# Patient Record
Sex: Female | Born: 1980 | Race: White | Hispanic: No | Marital: Married | Smoking: Never smoker
Health system: Southern US, Community
[De-identification: ages and names within clinical notes are randomized; demographics above are authoritative.]

## PROBLEM LIST (undated history)

## (undated) DIAGNOSIS — F32A Depression, unspecified: Secondary | ICD-10-CM

## (undated) DIAGNOSIS — D689 Coagulation defect, unspecified: Secondary | ICD-10-CM

## (undated) DIAGNOSIS — R011 Cardiac murmur, unspecified: Secondary | ICD-10-CM

## (undated) DIAGNOSIS — F329 Major depressive disorder, single episode, unspecified: Secondary | ICD-10-CM

## (undated) DIAGNOSIS — F419 Anxiety disorder, unspecified: Secondary | ICD-10-CM

## (undated) DIAGNOSIS — C801 Malignant (primary) neoplasm, unspecified: Secondary | ICD-10-CM

## (undated) HISTORY — DX: Coagulation defect, unspecified: D68.9

## (undated) HISTORY — PX: COSMETIC SURGERY: SHX468

## (undated) HISTORY — PX: HERNIA REPAIR: SHX51

## (undated) HISTORY — DX: Cardiac murmur, unspecified: R01.1

## (undated) HISTORY — PX: SMALL INTESTINE SURGERY: SHX150

## (undated) HISTORY — DX: Malignant (primary) neoplasm, unspecified: C80.1

## (undated) HISTORY — DX: Major depressive disorder, single episode, unspecified: F32.9

## (undated) HISTORY — PX: JOINT REPLACEMENT: SHX530

## (undated) HISTORY — DX: Depression, unspecified: F32.A

## (undated) HISTORY — DX: Anxiety disorder, unspecified: F41.9

## (undated) HISTORY — PX: GASTRIC BYPASS: SHX52

---
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
## 2002-02-27 ENCOUNTER — Other Ambulatory Visit: Admission: RE | Admit: 2002-02-27 | Discharge: 2002-02-27 | Payer: Self-pay | Admitting: *Deleted

## 2002-05-02 ENCOUNTER — Ambulatory Visit (HOSPITAL_COMMUNITY): Admission: RE | Admit: 2002-05-02 | Discharge: 2002-05-02 | Payer: Self-pay | Admitting: *Deleted

## 2003-03-28 ENCOUNTER — Other Ambulatory Visit: Admission: RE | Admit: 2003-03-28 | Discharge: 2003-03-28 | Payer: Self-pay | Admitting: *Deleted

## 2006-03-10 ENCOUNTER — Inpatient Hospital Stay (HOSPITAL_COMMUNITY): Admission: EM | Admit: 2006-03-10 | Discharge: 2006-03-14 | Payer: Self-pay | Admitting: Emergency Medicine

## 2006-03-11 ENCOUNTER — Encounter (INDEPENDENT_AMBULATORY_CARE_PROVIDER_SITE_OTHER): Payer: Self-pay | Admitting: *Deleted

## 2006-03-16 ENCOUNTER — Ambulatory Visit: Payer: Self-pay | Admitting: Internal Medicine

## 2006-09-14 ENCOUNTER — Ambulatory Visit (HOSPITAL_BASED_OUTPATIENT_CLINIC_OR_DEPARTMENT_OTHER): Admission: RE | Admit: 2006-09-14 | Discharge: 2006-09-15 | Payer: Self-pay | Admitting: Orthopaedic Surgery

## 2007-06-21 ENCOUNTER — Ambulatory Visit (HOSPITAL_BASED_OUTPATIENT_CLINIC_OR_DEPARTMENT_OTHER): Admission: RE | Admit: 2007-06-21 | Discharge: 2007-06-21 | Payer: Self-pay | Admitting: Orthopaedic Surgery

## 2011-04-07 NOTE — Op Note (Signed)
[name], Wanda Jackson                  ACCOUNT NO.:  [id number]   MEDICAL RECORD NO.:  [id number]          PATIENT TYPE:  AMB   LOCATION:  DSC                          FACILITY:  [facility]   PHYSICIAN:  Lubertha Basque. Dalldorf, M.D.DATE OF BIRTH:  02-Nov-1981   DATE OF PROCEDURE:  06/21/2007  DATE OF DISCHARGE:                               OPERATIVE REPORT   PREOPERATIVE DIAGNOSIS:  Painful retained hardware, right knee.   POSTOPERATIVE DIAGNOSIS:  Painful retained hardware, right knee.   PROCEDURE:  Removal OF hardware, right knee.   ANESTHESIA:  General.   ATTENDING SURGEON:  Lubertha Basque. Jerl Santos, M.D.   ASSISTANT:  Lindwood Qua, P.A.   INDICATIONS FOR PROCEDURE:  The patient is a 30 year old woman about a  year from a Fulkerson slide procedure designed to decompress her  patellofemoral joint.  That has worked out fairly well, but she has been  left with some anterior knee pain when she kneels directly on the  prominent hardware.  By x-ray, her osteotomy has healed, and she is  offered hardware removal at this point.  Informed operative consent was  obtained after discussion of possible complications of reaction to  anesthesia and infection.   SUMMARY OF FINDINGS OF PROCEDURE:  Under general anesthesia, 2 small  fragment set screws were removed from the tibial tubercle region through  2 small incisions.  I used fluoroscopy throughout the case to make  appropriate intraoperative decisions and read all of these views myself.   DESCRIPTION OF PROCEDURE:  The patient was taken to the operating suite  where general anesthetic was applied without difficulty.  She was  positioned supine and prepped and draped in a normal sterile fashion.  After administration of IV Kefzol, her hardware was localized by  fluoroscopy.  Then, I made 2 small stab wounds with dissection down to  the small fragment set screws.  These were removed with the appropriate  screwdriver.  Fluoroscopy was again used to  confirm adequate removal of  hardware, and I read these views myself.  The wounds were irrigated.  We  performed reapproximation of the skin with nylon.  Some Adaptic was  applied after a local anesthetic was injected.  Dry gauze and a loose  Ace wrap were then placed.  Estimated blood loss and intraoperative  fluids can be obtained from anesthesia records.  No tourniquet utilized.   DISPOSITION:  The patient was extubated in the operating room and taken  to the recovery in stable condition.  She was to go home the same-day  and follow up in the office in less than a week.  I will contact her by  phone tonight.      Lubertha Basque Jerl Santos, M.D.  Electronically Signed     PGD/MEDQ  D:  06/21/2007  T:  06/21/2007  Job:  [id number]

## 2011-04-10 NOTE — H&P (Signed)
[facility]  Patient:    Wanda Jackson, Wanda Jackson                           MRN: [id number] Attending:  Lenoard Aden, M.D.                         History and Physical  CHIEF COMPLAINT:              Pelvic pain.  HISTORY OF PRESENT ILLNESS:   The patient is a 30 year old white female, G0, P0 who presents with persistent left ovarian mass on ultrasound over the past two months with worsening symptoms.  The mass appears simple but suppurative. She presents for definitive therapy.  PAST MEDICAL HISTORY:         Remarkable for recurrent knee injury, pending arthroscopy.  FAMILY HISTORY:               Noncontributory.  MEDICATIONS:                  ORTHO TRI-CYCLEN.  ALLERGIES:                    BEEF.  SOCIAL HISTORY:               She is a nonsmoker, nondrinker.  She denies domestic or physical violence.  REVIEW OF SYSTEMS:            Persistent intermittent left and right-sided pain with persistent ovarian mass on ultrasound serialized between April and June.  PHYSICAL EXAMINATION:  GENERAL:                      Well-developed, well-nourished white female in no apparent distress.  HEENT:                        Normal.  LUNGS:                        Clear.  HEART:                        Regular rate and rhythm.  ABDOMEN:                      Soft, nontender.  PELVIC:                       Reveals an anteflexed uterus and right adnexal tenderness.  Left adnexa is more tender than the right with fullness noted.  IMPRESSION:                   Persistent ovarian cyst with symptomatic pelvic pain.  PLAN:                         Proceed with diagnostic laparoscopy and ovarian cystectomy.  Risks of anesthesia, infection, bleeding, injury to abdominal organs, and need for repair were discussed.  The patient acknowledges this and desires to proceed.  Delayed versus immediate complications to include bowel and bladder injury are noted and inability to  cure pain discussed.  The patient will proceed. DD:  05/26/01 TD:  05/27/01 Job: [id number]

## 2011-04-10 NOTE — Discharge Summary (Signed)
Wanda Jackson, Wanda Jackson                  ACCOUNT NO.:  [id number]   MEDICAL RECORD NO.:  [id number]          PATIENT TYPE:  INP   LOCATION:  1611                         FACILITY:  [facility]   PHYSICIAN:  Kela Millin, M.D.DATE OF BIRTH:  July 07, 1981   DATE OF ADMISSION:  03/09/2006  DATE OF DISCHARGE:  03/14/2006                           DISCHARGE SUMMARY - REFERRING   DISCHARGE DIAGNOSES:  1.  Colitis, likely infectious.  2.  Hypokalemia, resolved.   CONSULTATIONS:  Gastroenterology.   PROCEDURES:  1.  CT scan of the abdomen - Findings consistent with colitis of the      ascending colon and to the hepatic flexure. Some inflammatory change in      the terminal ileum. Reactive adenopathy in the right lower quadrant      noted.  2.  Colonoscopy done on March 11, 2006 - Colitis identified in the ascending      colon with marked edema and ulceration. Appeared to be sharp demarcation      between the affected and non-affected area of the colon. Ischemia needs      to be considered as a possibility. Deeper intubation of the terminal      ileum revealed normal mucosa. Distal portion of the terminal ileum with      some erythema and this was biopsied.  3.  CT angiogram of the abdomen on March 13, 2006 - Improving right sided      colitis, right lower quadrant adenopathy, patent celiac, SMA, and IMA.      No evidence of ischemia or vascular disease.   HISTORY OF PRESENT ILLNESS:  The patient is a 30 year old white female who  presented with complaints of abdominal pain for 5 days. She reported that  initially the pain felt like a gas pain and started out in the right lower  quadrant, most upper side and became more severe. She also reported that on  the first day, she had diarrhea with no hematochezia or melena. She stated  that she took some Percocet that helped with the pain. She denied nausea or  vomiting, dysuria, chest pain, shortness of breath, and no hematemesis. She  admitted to  subjective fevers. The patient reported that her pain was worse  with any change of position. She denies any family history of inflammatory  bowel disease or connective tissue disease. In the ER, she had a CT scan of  the abdomen and the results are as stated above. She was admitted to the  [facility] for evaluation and management.   PHYSICAL EXAMINATION:  VITAL SIGNS:  Examination upon admission revealed a  temperature of 99.3, initially 100.6. Blood pressure 118/70. Pulse of 60.  Respiratory rate of 20. O2 sat of 99%.  HEENT:  Showeddry mucous membranes.  ABDOMEN:  On examination, she had right lower and upper quadrant tenderness  with greater tenderness in the mid abdominal area on the right side. No  rebound tenderness. Nondistended. No organomegaly and no masses palpable.  The rest of the physical examination was noted to be within normal limits.   LABORATORY DATA:  Imaging studies - As stated above.   Her white cell count was 17.4 with a hemoglobin of 13, hematocrit 37.9. with  a red count of 341. Urinalysis negative for infection. Neutrophil count 77%.  Potassium was 3.4. Sodium 137, chloride 104, CO2 28, glucose 88. LFT's  within normal limits. Lipase 16.   HOSPITAL COURSE BY PROBLEM:  PROBLEM #1:  COLITIS - Upon admission, the  patient was started on empiric antibiotics as well as IVfor pain management.  Gastroenterology was consulted at Dr. Kenna Gilbert office. She agreed with  antibiotics and a colonoscopy was done on March 11, 2006 and the results are  stated above. The patient was still having abdominal pain and so a CT  angiogram was done to rule out ischemia and the results are stated above -  negative for ischemic findings. The patient was started on a diet that  was__________. She is tolerating p.o. well at this time and will be  discharged on a low-residual diet. She is to continue Cipro and Flagyl  orally for a total of 10 days. She is to followup with Dr.  Elnoria Howard as scheduled  on March 17, 2006. The patient is also to followup with her primary care  physician.   PROBLEM #2:  HYPOKALEMIA - Her potassium was replaced during the hospital  stay. Her last potassium, prior to discharge, was 3.8.   DISCHARGE MEDICATIONS:  1.  Cipro 500 mg p.o. b.i.d. x6 more days.  2.  Flagyl 500 mg p.o. t.i.d. x6 more days.  3.  Percocet 1 to 2 tablets every 4 to 6 hours as needed for pain, #20      tablets prescribed.   FOLLOW UP:  1.  Dr. Elnoria Howard on March 17, 2006 as stated above.  2.  Primary care physician in a week.   CONDITION ON DISCHARGE:  Improved and stable.      Kela Millin, M.D.  Electronically Signed     ACV/MEDQ  D:  03/14/2006  T:  03/14/2006  Job:  [id number]   cc:   Jordan Hawks. Elnoria Howard, MD  Fax: [phone]   Anselmo Rod, M.D.  Fax: [phone]

## 2011-04-10 NOTE — Op Note (Signed)
[facility]  Patient:    Wanda Jackson, Wanda Jackson                         MRN: [id number] Proc. Date: 05/27/01 Adm. Date:  [id number] Attending:  Lenoard Aden CC:         [facility]   Operative Report  PREOPERATIVE DIAGNOSES:       1. Persistent left lower quadrant pain.                               2. Persistent left ovarian cyst.  POSTOPERATIVE DIAGNOSIS:      1. Left ovarian corpus luteum cyst.                               2. Left ovarian follicular cyst.                               3. Left ovarian excrescence suspicious for                                  questionable hygroma.  OPERATION:                    1. Diagnostic laparoscopy.                               2. Ovarian cystectomy x 2.                               3. Excision of ovarian excrescence.  SURGEON:                      Lenoard Aden, M.D.  ANESTHESIA:                   General.  ANESTHESIOLOGIST:             Raul Del, M.D.  ESTIMATED BLOOD LOSS:         50 cc.  COMPLICATIONS:                None.  DRAINS:                       None.  SPECIMEN:                     Right ovarian cyst wall, right ovarian excrescence.  DISPOSITION:                  The patient went to the recovery room in good condition, all counts correct.  DESCRIPTION OF PROCEDURE:     After being apprised of risks of anesthesia, infection, bleeding, injury to abdominal organs, need for repair, the patient was brought to the operating room where she was administered general anesthetic without complication.  Prepped and draped in the usual sterile fashion, catheterized until bladder was empty.  Hulka tenaculum was placed per vagina after examination under anesthesia reveals an anteflexed, normal size uterus and no adnexal masses.  After achieving adequate anesthesia and placement of the Hulka tenaculum, infraumbilical  incision is made with the scalpel after placing Marcaine solution.   Veress needle placed.  Opening pressure -2 noted.  Hanging drop test consistent with intraperitoneal entry.  CO2 insufflated 5 liters after patient pressure set to 25.  After placement of the scope, telescope revealed normal trocar entry, atraumatic trocar entry, normal liver and gallbladder bed, normal appendix, normal size uterus, normal right tube and right ovary. Left ovary is enlarged with vascular-appearing area over a small cyst at the cephalad pole and larger posterior wall cyst in addition to a questionable excrescence.  Five millimeter trocar sites are made in left lower quadrant and mid lower quadrant.  After placement of a dilute Marcaine solution, 5 mm trocar is placed.  Ovary is rotated by grasping the right ovarian ligament and cauterized over the first ovarian cyst.  The area of cautery is incised using scissors, and the cyst wall is excised using sharp dissection.  Second cyst is then cauterized over its base, excised.  Clear fluid excised from the cyst, and cyst wall is cauterized and removed at the base. At this time, an excrescence is noted at the attachment of the fimbria overica.  The area where the fimbria attaches to this excrescence is cauterized using bipolar cautery. The excrescence is then grasped using traumatic forceps and excised completely using sharp dissection.  At this time, good hemostasis is achieved. Irrigation is accomplished revealing a normal right ovarian cortex wall without any evidence of active bleeding.  Normal left ovarian cortex without any bleeding.  Right ovary is visualized and found to be normal.  Irrigation is accomplished.  Questionable areas consistent with endometriosis are noted in the cul-de-sac, but after exploration, these are consistent with small adherent blood clots.  At this time, all instruments are removed under direct visualization.  CO2 is released.  Incision was closed using 0 and 4-0 Vicryl. The patient tolerated the  procedure well and was transferred to the recovery room in good condition. DD:  05/27/01 TD:  05/27/01 Job: [id number]

## 2011-04-10 NOTE — Op Note (Signed)
[facility]  Patient:    Wanda Jackson, Wanda Jackson Visit Number: [id number] MRN: [id number]          Service Type: DSU Location: [facility] Attending Physician:  Ermalene Searing Dictated by:   Marina Gravel, M.D. Proc. Date: 05/02/02 Admit Date:  05/02/2002 Discharge Date: 05/02/2002                             Operative Report  PREOPERATIVE DIAGNOSIS:       Chronic left lower quadrant pain.  POSTOPERATIVE DIAGNOSIS:      Chronic left lower quadrant pain.  OPERATION:                    1. Open diagnostic laparoscopy.                               2. Fulguration of single questionable                                  endometrial implant on left ovary.                               3. Lysis of single adhesion from left                                  tube to left ovary.  SURGEON:                      Marina Gravel, M.D.  ANESTHESIA:                   General.  FINDINGS:                     Minimal scarring from the left tube to the ovary which was lysed.  Questionable endometrial implant on inferior aspect of the left ovary which was fulgurated with bipolar.  Otherwise normal-appearing pelvis, normal-appearing appendix and upper abdomen.  ESTIMATED BLOOD LOSS:         50 cc.  COMPLICATIONS:                None.  INDICATIONS:                  The patient has a history of chronic left lower quadrant pain status post operative laparoscopy and left ovarian cystectomy about one year ago.  The patient has failed medical management and desires definitive diagnosis and treatment.  DESCRIPTION OF PROCEDURE:     The patient was taken to the operating room and general anesthesia obtained.  The patient was placed in ski position and abdomen prepped and draped in standard fashion.  Bladder was emptied with a red rubber catheter.  Speculum was inserted and the anterior lip of the cervix grasped with a tooth tenaculum.  Gowns and gloves were changed.  Attention was  turned to the abdomen.  A 10 mm infraumbilical skin fold incision was made with the knife.  This was carried sharply to the underlying fascia.  The fascia was divided in the midline with the knife and elevated with Kocher clamps.  The patients anterior sheath and peritoneum were divided sharply with  a knife.  Pursestring suture of 0 Vicryl was placed around the fascial defect and the Hasson cannula inserted and secured.  The patient was placed in Trendelenburg position and the scope inserted. Intra-abdominal placement confirmed.  Pneumoperitoneum obtained with CO2 gas. A 5 mm port was inserted about 3 cm above the symphysis and 6 cm off the midline under direct laparoscopic visualization.  The bowel was mobilized superiorly and the pelvis and abdomen inspected with the above findings noted.  There was one questionable area that looked like an endometrial implant on the inferior surface of the left ovary.  It was fulgurated with bipolar cautery. Hemostasis was obtained.  There was a single filmy adhesion from the left tube to the left ovary which was divided sharply.  The remainder of the pelvis and abdomen appeared normal; therefore, the procedure was terminated.  The inferior port was removed and inspected with a laparoscope.  It was hemostatic.  Scope was removed, gas released, and the Hasson cannula removed. I placed my index finger through the fascial defect and snugged this down which obliterated the defect.  No intra-abdominal contents or hernia in incision prior to closure.  The skin was closed with subcuticular 3-0 Vicryl at each side.  The patient tolerated the procedure well.  There were no complications.  She was taken to the recovery room awake, alert, and in stable condition.  All counts were correct per the operating room staff. Dictated by:   Marina Gravel, M.D. Attending Physician:  Marina Gravel B DD:  05/02/02 TD:  05/04/02 Job: [id number]

## 2011-04-10 NOTE — Op Note (Signed)
[place]  Patient:    Wanda Jackson, DUCH Visit Number: [id number] MRN: [id number]          Service Type: DSU Location: [facility] Attending Physician:  Marcene Corning Dictated by:   Lubertha Basque. Jerl Santos, M.D. Proc. Date: 09/06/01 Admit Date:  09/06/2001                             Operative Report  PREOPERATIVE DIAGNOSIS:  Left knee chondromalacia patella.  POSTOPERATIVE DIAGNOSIS:  Left knee chondromalacia patella.  OPERATION PERFORMED: 1. Left knee chondroplasty patella. 2. Left knee lateral release.  ANESTHESIA:  General.  ATTENDING SURGEON:  Lubertha Basque. Jerl Santos, M.D.  ASSISTANT:  Lindwood Qua, P.A.  INDICATIONS FOR PROCEDURE:  The patient is an 30 year old woman several months after a right knee operation for chondromalacia patella.  She has had similar symptoms on the left for many years and wishes to have the same procedure performed.  The procedure was discussed with the patient and informed operative consent was obtained after discussion of possible complications of reaction to anesthesia and infection.  DESCRIPTION OF PROCEDURE:  The patient was taken to an operating suite where general anesthetic was applied without difficulty.  She was then positioned supine and prepped and draped in normal sterile fashion.  After administration of preop intravenous antibiotics, arthroscopy of the left knee was performed through a total of three portals.  The suprapatellar pouch was benign, the patellofemoral joint did track in a lateral position.  She had some breakdown of the cartilage on the far medial aspect and this was addressed with a chondroplasty.  A lateral release was performed through the third portal.  She did have a very tight lateral structures and once this was accomplished, the kneecap tracked in a much better position.  Bleeding was controlled with Bovie cautery.  Medial and lateral compartment showed no evidence of meniscal  or articular cartilage injury and the ACL was intact though it appeared to be bifid with some synovium growing in the midsection of the structure.  The PCL also was intact.  The knee was thoroughly irrigated followed by the placement of Marcaine with epinephrine and morphine.  Adaptic was placed over the portals followed by dry gauze and a loose Ace wrap.  Estimated blood loss and intraoperative fluids can be obtained from Anesthesia records.  DISPOSITION:  The patient was extubated in the operating room and taken to the recovery room in stable condition.  Plans were for her to go home the same and follow up in the office in less than a week.  I will contact her by phone tonight. DISPOSITION:  The patient was extubated in the operating room and taken to the recovery room in stable condition.  Plans were for  to go home the same day and to follow up in the office in less than a week.  I will contact  by phone tonight. Dictated by:   Lubertha Basque Jerl Santos, M.D. Attending Physician:  Marcene Corning DD:  09/06/01 TD:  09/06/01 Job: [id number]

## 2011-04-10 NOTE — Consult Note (Signed)
NAMEBRITTINI, BRUBECK                  ACCOUNT NO.:  [id number]   MEDICAL RECORD NO.:  [id number]          PATIENT TYPE:  INP   LOCATION:  0103                         FACILITY:  [facility]   PHYSICIAN:  Anselmo Rod, M.D.  DATE OF BIRTH:  Mar 14, 1981   DATE OF CONSULTATION:  03/10/2006  DATE OF DISCHARGE:                                   CONSULTATION   REASON FOR CONSULTATION:  Right lower quadrant pain with right-sided colitis  on CT done earlier today.   ASSESSMENT:  1.  Right-sided colitis, rule out infectious versus inflammatory causes.  2.  Hypokalemia.   RECOMMENDATIONS:  1.  Agree with IV antibiotics (Cipro and Flagyl).  2.  A colonoscopy is planned tomorrow.  Will prep the patient tonight.  3.  Continue serial CBCs and monitor the white count closely.  4.  Avoid all nonsteroidals for now.   DISCUSSION:  Ms. Wanda Jackson is a 30 year old white female who was in her  usual state of health until about four days ago when she developed right  lower quadrant pain with chills and low-grade fever.  She had diarrhea for  about 24 hours and had four loose stools initially.  The diarrhea subsided  on its own but diarrhea persisted which prompted her to come to the  emergency room yesterday.  She denies any nausea, vomiting.  She has had  some blood in the stool on one occasion on Sunday which is about three days  ago, but not since then.  She has had averaged one to two bowel movements  per day.  After that, her appetite has been decreased but her weight has  been stable.  There is no known history of ulcers, jaundice, or colitis.  She denies a family history of IBD or colon cancer.   PAST MEDICAL HISTORY:  Patient has had laparoscopic surgery for removal of  an ovarian cyst and bilateral knee surgeries for cartilage repair.   ALLERGIES:  No known drug allergies.   MEDICATIONS:  None.   SOCIAL HISTORY:  She works at [employer] as a Engineer, materials.  She is single and has no  children.  She denies  use of tobacco history or drugs.  She drinks alcohol  on social basis.   PHYSICAL EXAMINATION:  GENERAL:  Pleasant, cooperative young white female in  no acute distress.  VITAL SIGNS:  Temperature 100.6, pulse 90 per minute, respiratory rate 20,  blood pressure 108/60.  HEENT:  AT/[place].  PERRLA.  EOMI; facial symmetry preserved. Oropharyngeal  mucosa without exudate.  NECK:  Supple.  No JVD, thyromegaly, lymphadenopathy.  CHEST:  Clear to auscultation.  S1, S2.  Regular.  No murmur, rub, gallop,  rubs, rhonchi, wheezing.  ABDOMEN:  Soft with laparoscopic scars present in the lower abdomen.  Soft  with significant right lower quadrant tenderness on palpation with guarding.  No rebound or rigidity.  No hepatosplenomegaly.  RECTAL:  Digital rectal examination revealed guaiac-positive brown stool.  No other masses palpable on digital examination.   LABORATORIES:  On admission revealed a white count of 17.4, hemoglobin 13  g/dl, hematocrit 04.5, MCV 87, neutrophil 77%.  Urine wbc was 0-2 with a few  epithelial cells and rare bacteria present.  Sodium 137, potassium 3.4,  chloride 104, CO2 28, glucose 88, BUN 9, creatinine 0.8, calcium 8.9, total  protein 6.5, albumin 3.1, AST 14, ALT 12, alkaline phosphatase 54, total  bilirubin 0.7.  A CT scan of the abdomen and pelvis showed insignificant  inflammation in the right colon and the terminal ileum.  Plans are for  colonoscopy tomorrow to establish a diagnosis and further recommendation  will be made in follow up.      Anselmo Rod, M.D.  Electronically Signed     JNM/MEDQ  D:  03/10/2006  T:  03/11/2006  Job:  [id number]

## 2011-04-10 NOTE — H&P (Signed)
[facility]  Patient:    Wanda Jackson, Wanda Jackson Visit Number: [id number] MRN: [id number]          Service Type: Attending:  Marina Gravel, M.D. Dictated by:   Marina Gravel, M.D. Adm. Date:  05/02/02                           History and Physical  PREOPERATIVE DIAGNOSIS:       1. Chronic left lower quadrant pain.                               2. History of previous left ovarian cyst and                                  left ovarian cystectomy.  HISTORY OF PRESENT ILLNESS:   An 30 year old white female, gravida 0, who presents for definitive surgical diagnosis of chronic left lower quadrant pain.  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  She states that the pain is persistent, aggravating, and not relieved with Tylenol or nonsteroidals.  She states that the pain is sometimes aggravated after bowel movement and she has had some constipation.  History of left ovarian corpus luteum cyst excised last July with pain which has developed since that time.  PAST MEDICAL HISTORY:         Recent community-acquired pneumonia, resolved. Otherwise negative.  PAST SURGICAL HISTORY:        1. Laparoscopy as above.                               2. Knee surgery.  MEDICATIONS:                  Ortho-Evra patch.  ALLERGIES:                    None.  SOCIAL HISTORY:               No alcohol, tobacco, or other drugs.  FAMILY HISTORY:               Otherwise noncontributory as she is adopted.  REVIEW OF SYSTEMS:            Otherwise negative.  LABORATORY DATA:              A recent pelvic ultrasound showed no abnormalities of the uterus or ovaries.  PHYSICAL EXAMINATION:  VITAL SIGNS:                  Blood pressure 110/80, pulse 68.  WEIGHT:                       188.4 pounds.  GENERAL APPEARANCE:           Alert and oriented and in no acute  distress.  SKIN:                         Warm and dry.  No lesions.  NECK:  Supple.  No thyromegaly.  HEART:                        Regular rate and rhythm.  LUNGS:                        Clear to auscultation.  ABDOMEN:                      Liver and spleen normal.  No hernia.  PELVIC:                       Normal.  External genitalia, vagina, and cervix normal.  Uterus normal.  Left lower quadrant mild tenderness to palpation.  No adnexal masses palpable.  Right adnexa normal.  ASSESSMENT:                   Left lower quadrant chronic pain possibly related to scar tissue from previous surgery.  I discussed with the patient that the differential includes such things as adhesive disease, endometriosis, bowel symptoms such as irritable bowel syndrome, or no pathology may be diagnosed at the time of surgery.  The patient is in agreement.  Operative risks discussed, including infection, bleeding, damage to gallbladder, and surrounding organs.  All questions answered.  Arrangements have been made for the above procedure. Dictated by:   Marina Gravel, M.D. Attending:  Marina Gravel, M.D. DD:  04/28/02 TD:  04/29/02 Job: [id number]

## 2011-04-10 NOTE — Op Note (Signed)
NAMEOMNI, DUNSWORTH                  ACCOUNT NO.:  [id number]   MEDICAL RECORD NO.:  [id number]          PATIENT TYPE:  AMB   LOCATION:  DSC                          FACILITY:  [facility]   PHYSICIAN:  Lubertha Basque. Dalldorf, M.D.DATE OF BIRTH:  Jul 31, 1981   DATE OF PROCEDURE:  09/14/2006  DATE OF DISCHARGE:                                 OPERATIVE REPORT   PREOPERATIVE DIAGNOSIS:  Right knee chondromalacia patella.   POSTOPERATIVE DIAGNOSIS:  1. Right knee loose body.  2. Right knee chondromalacia patella.   PROCEDURES:  1. Right knee arthroscopic removal loose body.  2. Right knee arthroscopic lateral release.  3. Right knee open Fulkerson slide extensor mechanism realignment.   ANESTHESIA:  General.   ATTENDING SURGEON:  Lubertha Basque. Jerl Santos, M.D.   ASSISTANT:  Lindwood Qua, P.A.   INDICATIONS FOR PROCEDURE:  The patient is a 30 year old woman with a long  history of right knee pain.  We have performed arthroscopy on her knee twice  in the past.  The first procedure was in 2002 at which point we performed a  lateral release and a chondroplasty of the patella.  Four years later, in  2006, we had to go back and perform a second chondroplasty.  She had  significant breakdown of the patellar cartilage with bare bone exposed and  large defects.  We performed arthroscopic abrasion.  She has persisted with  some pain but not much of an effusion.  She has undergone a repeat MRI scan  which shows pathology limited to the patellofemoral portion of her knee.  This limits her significantly in terms of exercise and walking and stair  climbing and resting and she is offered a procedure in hopes of  decompressing the patellofemoral joint.  We have gone over a Fulkerson slide  procedure where we will try to move the tibial tubercle in an anterior  direction and decompress the patellofemoral joint.  Informed operative  consent was obtained after discussion about complications of reaction to  anesthesia and infection.  The patient also understands about the  significant therapy program required after this type of operation to  optimize result.   SUMMARY OF FINDINGS AND PROCEDURE:  Under general anesthesia a right knee  procedure was performed.  We first performed an arthroscopy through her old  portals.  She did have a very large loose body perhaps 1 x 0.5 x 0.5 cm  which was removed.  The meniscal structures appeared intact.  There were no  degenerative changes in the medial or lateral compartment.  The ACL appeared  to be intact and normal.  The patellofemoral joint did track in a far  lateral position.  The defects that we had seen last year had filled in  nicely after the abrasion arthroplasty.  I performed an arthroscopic lateral  release to help decompress the patellofemoral joint and then we removed the  arthroscopic equipment.  We then performed an open Fulkerson slide  procedure.  I did tilt the cut about 45 degrees in hopes of moving the  tibial tubercle in an anterior as well  as medial position.  This was then  secured with screws from the small fragment set.  I used fluoroscopy  throughout the case to make appropriate intraoperative decisions and read  all these views myself.  Lindwood Qua, P.A. assisted throughout and was  invaluable to the completion of the case in that he helped retract and  position so I could perform the procedure.  He also closed simultaneously to  help minimize OR time.   DESCRIPTION OF PROCEDURE:  The patient was taken to the operating suite  where general anesthetic was applied without difficulty.  She was positioned  supine and prepped, draped normal sterile fashion.  After the administration  of preop IV Kefzol, an arthroscopy of the right knee was performed through  three old portals.  Findings were as noted above. Procedure consisted of  removal of fairly large loose body and the lateral release.  A brief  chondroplasty was done  of the patellofemoral joint but again this had filled  in nicely after the abrasion done last year.  The intertrochlear groove was  completely spared.  The arthroscopic equipment was removed followed by  exsanguination of the leg and inflation of the tourniquet about the thigh.  The anterior incision was made from the tibial tubercle distal with  dissection down to the patellar tendon attachment.  The lateral release was  extended from this site up to the arthroscopic portion.  This was done with  Metzenbaum scissors.  Fluid from the knee was evacuated thereafter.  I then  used an oscillating saw to make a cut at the tibial tubercle.  This tapered  from proximal to distal and was made with a 45 degrees bevel in hopes that  when we moved this in a medial direction it would move the tibial tubercle  also in an anterior direction.  This was completed with osteotomes.  I then  translated the tibial tubercle in a medial direction and an anterior  direction approximately 1 cm.  This was then secured with two partially  threaded cancellus screws from a small fragment set.  I used fluoroscopy to  confirm adequate placement of hardware.  I then placed the scope back in the  knee.  The patella tracked in a normal position.  Prior to the realignment  at 90 degrees of flexion her knee cap stayed out lateral to the  intertrochlear groove.  Once this procedure had been done at 60 degrees of  knee flexion the patella sat centrally in the intertrochlear groove.  The  knee was thoroughly irrigated followed by removal of equipment.  The  tourniquet was deflated and a small amount of bleeding was easily controlled  with Bovie cautery.  Deep tissues then reapproximated with 2-0 undyed Vicryl  followed by skin closure with nylon.  Adaptic was applied to the wounds  followed by dry gauze and loose Ace wrap.  Estimated blood loss and intraoperative fluids as well as accurate tourniquet time can be obtained  from  anesthesia records.   DISPOSITION:  The patient was extubated in the operating room and taken to  recovery room in stable addition.  She was to stay overnight for pain  control, probable discharge home in the morning.      Lubertha Basque Jerl Santos, M.D.  Electronically Signed     PGD/MEDQ  D:  09/14/2006  T:  09/15/2006  Job:  [id number]

## 2011-04-10 NOTE — H&P (Signed)
Wanda Jackson, Wanda Jackson                  ACCOUNT NO.:  [id number]   MEDICAL RECORD NO.:  [id number]          PATIENT TYPE:  INP   LOCATION:  1611                         FACILITY:  [facility]   PHYSICIAN:  Kela Millin, M.D.DATE OF BIRTH:  07-Dec-1980   DATE OF ADMISSION:  03/09/2006  DATE OF DISCHARGE:                                HISTORY & PHYSICAL   PRIMARY CARE PHYSICIAN:  Unassigned.   CHIEF COMPLAINT:  Abdominal pain.   HISTORY OF PRESENT ILLNESS:  The patient is a 30 year old white female who  presents with complaints of abdominal pain x5 days.  She states that  initially it felt like gas pain, and started out in her right lower  quadrant and moved up the right side of her abdomen and became more severe.  She states that on the first day she also had diarrhea, four to five watery  stools, with no hematochezia and no melena.  She reports that she had some  leftover Percocet from her previous knee surgeries and so was taking those  for the pain.  She denies nausea, vomiting, dysuria, chest pain, shortness  of breath, and no hematemesis.  She admits to subjective fevers.  The  patient states that her pain is worsened by any change of position.  Wanda Jackson also denies any family history of inflammatory bowel disease or  connective tissue disease.  The patient was seen in the ER and a CT scan of  her abdomen revealed marked inflammatory changes in her colon with some  involvement of the terminal ileum and she is admitted to the [facility] service for further evaluation and management.   PAST SURGICAL HISTORY:  Status post bilateral knee surgeries.   MEDICATIONS:  Percocet p.r.n.   ALLERGIES:  NKDA.   SOCIAL HISTORY:  She denies tobacco, occasional alcohol.   FAMILY HISTORY:  Her grandfather has diabetes.   REVIEW OF SYSTEMS:  As per HPI.  Other review of systems negative.   PHYSICAL EXAMINATION:  GENERAL:  The patient is a young, white female,  pleasant in no apparent  distress.  VITAL SIGNS:  Her temperature is 99.3, initially 100.6.  Blood pressure  118/70, pulse 60, respiratory rate 20, and her O2 sat is 99%.  HEENT:  PERRL.  EOMI.  Slightly dry mucous membranes.  NECK:  Supple.  No adenopathy and no thyromegaly.  LUNGS:  Clear to auscultation bilaterally.  No wheezes.  CARDIOVASCULAR:  Regular rate and rhythm.  Normal S1 S2.  ABDOMEN:  Soft.  Bowel sounds present.  She has right upper and lower  quadrant tenderness with greater tenderness in her mid right abdominal area.  No rebound tenderness and nondistended.  No organomegaly and no masses  palpable.  EXTREMITIES:  No cyanosis and no edema.  NEUROLOGIC:  Alert and oriented x3.  Cranial nerves II-XII grossly intact.  Nonfocal exam.   LABORATORY DATA:  CT scan of the abdomen and pelvis shows marked  inflammatory changes from the ascending colon to the hepatic flexure with  some involvement of terminal ileum, compatible with infectious versus  inflammatory colitis/ileitis.  Her white cell count is 17.4 with a  hemoglobin of 13, hematocrit 37.9, platelet count 341, neutrophil count 77%.  Her urinalysis is negative for infection and urine pregnancy test is  negative.  Her sodium is 137, potassium 3.4, chloride 104, CO2 is 28,  glucose is 88, BUN is 9, creatinine is 0.8, and her LFTs are within normal  limits.  Her lipase is 16.   ASSESSMENT/PLAN:  1.  Colitis.  Ascending colon to hepatic flexure and terminal ileum      infectious versus inflammatory.  We will start empiric antibiotics,      intravenous analgesics for pain management, keep NPO, hydrate, obtain      stool studies.  I will consult gastroenterology for a possible      endoscopy.  2.  Hypokalemia secondary to gastrointestinal loss, replace.      Kela Millin, M.D.  Electronically Signed     ACV/MEDQ  D:  03/10/2006  T:  03/10/2006  Job:  [id number]

## 2011-09-07 LAB — POCT HEMOGLOBIN-HEMACUE
Hemoglobin: 12.5
Operator id: 116011

## 2016-04-21 ENCOUNTER — Ambulatory Visit (HOSPITAL_COMMUNITY)
Admission: RE | Admit: 2016-04-21 | Discharge: 2016-04-21 | Disposition: A | Discharge status: Home / Self Care | Source: Ambulatory Visit | Attending: Family Medicine | Admitting: Family Medicine

## 2016-04-21 ENCOUNTER — Ambulatory Visit (INDEPENDENT_AMBULATORY_CARE_PROVIDER_SITE_OTHER): Admitting: Family Medicine

## 2016-04-21 VITALS — BP 110/80 | HR 67 | Temp 98.2°F | Resp 18

## 2016-04-21 DIAGNOSIS — R5383 Other fatigue: Secondary | ICD-10-CM | POA: Diagnosis not present

## 2016-04-21 DIAGNOSIS — S0180XA Unspecified open wound of other part of head, initial encounter: Secondary | ICD-10-CM

## 2016-04-21 DIAGNOSIS — S0541XA Penetrating wound of orbit with or without foreign body, right eye, initial encounter: Secondary | ICD-10-CM | POA: Diagnosis not present

## 2016-04-21 DIAGNOSIS — G4700 Insomnia, unspecified: Secondary | ICD-10-CM

## 2016-04-21 DIAGNOSIS — X58XXXA Exposure to other specified factors, initial encounter: Secondary | ICD-10-CM | POA: Insufficient documentation

## 2016-04-21 DIAGNOSIS — S069X3A Unspecified intracranial injury with loss of consciousness of 1 hour to 5 hours 59 minutes, initial encounter: Secondary | ICD-10-CM

## 2016-04-21 DIAGNOSIS — G44319 Acute post-traumatic headache, not intractable: Secondary | ICD-10-CM

## 2016-04-21 NOTE — Patient Instructions (Addendum)
Please go to [facility] for your scheduled CT Scan Go to first floor Radiology and someone will get you from there Please stay there until you are given further instructions once the CT is complete    IF you received an x-ray today, you will receive an invoice from [facility]. Please contact [facility] Radiology at [phone] with questions or concerns regarding your invoice.   IF you received labwork today, you will receive an invoice from [employer]. Please contact [facility] at [phone] with questions or concerns regarding your invoice.   Our billing staff will not be able to assist you with questions regarding bills from these companies.  You will be contacted with the lab results as soon as they are available. The fastest way to get your results is to activate your My Chart account. Instructions are located on the last page of this paperwork. If you have not heard from [place] regarding the results in 2 weeks, please contact this office.     We will schedule a CAT scan today to make sure there are no signs of bleeding, but this is less likely. For your sedation and trouble sleeping, I would try cutting back to 200 mg of Zoloft each day for now as this may be contributing. I would also avoid Benadryl or other sedating medications for the next 2-3 days with your recent head injury. Tylenol is okay to take for now if needed for headache, then in the next 2-3 days, if something stronger needed, we can look into options at that time.   WOUND CARE Please return in 7 days to have your stitches/staples removed or sooner if you have concerns. Marland Kitchen Keep area clean and dry for 24 hours. Do not remove bandage, if applied. . After 24 hours, remove bandage and wash wound gently with mild soap and warm water. Reapply a new bandage after cleaning wound, if directed. . Continue daily cleansing with soap and water until stitches/staples are removed. . Do not  apply any ointments or creams to the wound while stitches/staples are in place, as this may cause delayed healing. . Notify the office if you experience any of the following signs of infection: Swelling, redness, pus drainage, streaking, fever >101.0 F . Notify the office if you experience excessive bleeding that does not stop after 15-20 minutes of constant, firm pressure.    Head Injury, Adult You have received a head injury. It does not appear serious at this time. Headaches and vomiting are common following head injury. It should be easy to awaken from sleeping. Sometimes it is necessary for you to stay in the emergency department for a while for observation. Sometimes admission to the hospital may be needed. After injuries such as yours, most problems occur within the first 24 hours, but side effects may occur up to 7-10 days after the injury. It is important for you to carefully monitor your condition and contact your health care provider or seek immediate medical care if there is a change in your condition. WHAT ARE THE TYPES OF HEAD INJURIES? Head injuries can be as minor as a bump. Some head injuries can be more severe. More severe head injuries include:  A jarring injury to the brain (concussion).  A bruise of the brain (contusion). This mean there is bleeding in the brain that can cause swelling.  A cracked skull (skull fracture).  Bleeding in the brain that collects, clots, and forms a bump (hematoma). WHAT CAUSES A HEAD INJURY?  A serious head injury is most likely to happen to someone who is in a car wreck and is not wearing a seat belt. Other causes of major head injuries include bicycle or motorcycle accidents, sports injuries, and falls. HOW ARE HEAD INJURIES DIAGNOSED? A complete history of the event leading to the injury and your current symptoms will be helpful in diagnosing head injuries. Many times, pictures of the brain, such as CT or MRI are needed to see the extent  of the injury. Often, an overnight hospital stay is necessary for observation.  WHEN SHOULD I SEEK IMMEDIATE MEDICAL CARE?  You should get help right away if:  You have confusion or drowsiness.  You feel sick to your stomach (nauseous) or have continued, forceful vomiting.  You have dizziness or unsteadiness that is getting worse.  You have severe, continued headaches not relieved by medicine. Only take over-the-counter or prescription medicines for pain, fever, or discomfort as directed by your health care provider.  You do not have normal function of the arms or legs or are unable to walk.  You notice changes in the black spots in the center of the colored part of your eye (pupil).  You have a clear or bloody fluid coming from your nose or ears.  You have a loss of vision. During the next 24 hours after the injury, you must stay with someone who can watch you for the warning signs. This person should contact local emergency services (911 in the [place] if you have seizures, you become unconscious, or you are unable to wake up. HOW CAN I PREVENT A HEAD INJURY IN THE FUTURE? The most important factor for preventing major head injuries is avoiding motor vehicle accidents. To minimize the potential for damage to your head, it is crucial to wear seat belts while riding in motor vehicles. Wearing helmets while bike riding and playing collision sports (like football) is also helpful. Also, avoiding dangerous activities around the house will further help reduce your risk of head injury.  WHEN CAN I RETURN TO NORMAL ACTIVITIES AND ATHLETICS? You should be reevaluated by your health care provider before returning to these activities. If you have any of the following symptoms, you should not return to activities or contact sports until 1 week after the symptoms have stopped:  Persistent headache.  Dizziness or vertigo.  Poor attention and concentration.  Confusion.  Memory problems.  Nausea  or vomiting.  Fatigue or tire easily.  Irritability.  Intolerant of bright lights or loud noises.  Anxiety or depression.  Disturbed sleep. MAKE SURE YOU:   Understand these instructions.  Will watch your condition.  Will get help right away if you are not doing well or get worse.   This information is not intended to replace advice given to you by your health care provider. Make sure you discuss any questions you have with your health care provider.   Document Released: 11/09/2005 Document Revised: 11/30/2014 Document Reviewed: 07/17/2013 Elsevier Interactive Patient Education [employer].

## 2016-04-21 NOTE — Progress Notes (Signed)
By signing my name below I, Tereasa Coop, attest that this documentation has been prepared under the direction and in the presence of Wendie Agreste, MD. Electonically Signed. Tereasa Coop, Scribe 04/21/2016 at 1:35 PM  Subjective:    Patient ID: Wanda Jackson, female    DOB: 1981-10-21, 35 y.o.   MRN: [id number]  Chief Complaint  Patient presents with  . Fall    Pt. fell last night and hit head on counter.     HPI Wanda Jackson is a 35 y.o. female who presents to the [facility] complaining of fall injury to her rt eye. Pt reports difficulty staying asleep at night and feeling fatigued and falling asleep while standing for the past 4 months. Pt reports she must have fallen asleep last night while standing and fell against the sharp corner of her counter at her grandparents home where she was staying. Fall occurred 2:00 AM this morning.  Pt c/o HA and has history of concussion. HA is in rt temple area. HA has been worsening since onset earlier this morning. Pt denies any N/V/D, changes in her vision, heart palpitations, CP, SOB.   Pt also c/o worsening neck soreness that started after initial injury this morning.  Pt thinks she may have experienced LOC after hitting her head and is unsure as to how long she laid on the floor. Pt estimates LOC at approximately 1 hr.   Pt has been trying tylenol PM, benadryl with no relief of insomnia.   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   Pt denies any thoughts of suicide, self harm, homicide, or thoughts of harming others.   Pt has history of Factor V leiden.  Pt has history of gastric bypass surgery.  Pt lives in [place] and is visiting, staying with grandparents.   Depression screen PHQ 2/9 04/21/2016  Decreased Interest 0  Down, Depressed, Hopeless 0  PHQ - 2 Score 0       There are no active problems to display for this  patient.  Past Medical History  Diagnosis Date  . Anxiety   . Cancer ([place])   . Clotting disorder ([place])   . Depression   . Heart murmur    Past Surgical History  Procedure Laterality Date  . Cosmetic surgery    . Hernia repair    . Small intestine surgery    . Joint replacement    . Gastric bypass     Allergies  Allergen Reactions  . Bee Venom Anaphylaxis  . Latex Rash   Prior to Admission medications   Medication Sig Start Date End Date Taking? Authorizing Provider  ondansetron (ZOFRAN) 8 MG tablet Take 8 mg by mouth every 8 (eight) hours as needed for nausea or vomiting.   Yes Historical Provider, MD  sertraline (ZOLOFT) 100 MG tablet Take 300 mg by mouth daily.   Yes Historical Provider, MD   Social History   Social History  . Marital Status: Married    Spouse Name: N/A  . Number of Children: N/A  . Years of Education: N/A   Occupational History  . Not on file.   Social History Main Topics  . Smoking status: Never Smoker   . Smokeless tobacco: Not on file  . Alcohol Use: 0.0 oz/week    0 Standard drinks or equivalent per week  . Drug Use: No  .  Sexual Activity: Not on file   Other Topics Concern  . Not on file   Social History Narrative  . No narrative on file     Review of Systems  Constitutional: Negative for fatigue and unexpected weight change.  Respiratory: Negative for chest tightness and shortness of breath.   Cardiovascular: Negative for chest pain, palpitations and leg swelling.  Gastrointestinal: Negative for abdominal pain and blood in stool.  Neurological: Positive for syncope and headaches. Negative for dizziness and light-headedness.  Psychiatric/Behavioral: Positive for sleep disturbance. Negative for suicidal ideas and self-injury.       Objective:   Physical Exam  Constitutional: She is oriented to person, place, and time. She appears well-developed and well-nourished.  HENT:  Head: Normocephalic. Head is with laceration (1cm  superior lateral to upper rt eye lid, no eyelid/eye involvement.). Head is without raccoon's eyes and without Battle's sign.  Right Ear: Hearing, tympanic membrane and ear canal normal. No hemotympanum.  Left Ear: Tympanic membrane and ear canal normal. No hemotympanum.  Mouth/Throat: Oropharynx is clear and moist and mucous membranes are normal.  Pt tender in upper rt orbit and rt temple. Remainder of scalp unaffected.  Eyes: Conjunctivae and EOM are normal. Pupils are equal, round, and reactive to light. Right eye exhibits no nystagmus. Left eye exhibits no nystagmus.  Neck: Normal range of motion. Muscular tenderness (rt paracervical muscles) present. No spinous process tenderness present. Carotid bruit is not present.  Cardiovascular: Normal rate, regular rhythm, normal heart sounds and intact distal pulses.  Exam reveals no gallop.   No murmur heard. Pulmonary/Chest: Effort normal and breath sounds normal. No accessory muscle usage. No respiratory distress. She has no decreased breath sounds. She has no wheezes. She has no rhonchi. She has no rales.  Abdominal: Soft. She exhibits no pulsatile midline mass. There is no tenderness.  Musculoskeletal: Normal range of motion.  Neurological: She is alert and oriented to person, place, and time. She has normal strength. No cranial nerve deficit or sensory deficit. She displays a negative Romberg sign.  Pt has no pronator drift. Normal finger to nose. Pt able to walk heel to toe with no difficulty.   Skin: Skin is warm and dry. Laceration (1 cm, superior lateral to upper rt eyelid, no eyelid/eye involvement) noted.  Psychiatric: She has a normal mood and affect. Her behavior is normal. She expresses no homicidal and no suicidal ideation. She expresses no suicidal plans and no homicidal plans.  Vitals reviewed.    Filed Vitals:   04/21/16 1257  BP: 110/80  Pulse: 67  Temp: 98.2 F (36.8 C)  TempSrc: Oral  Resp: 18  SpO2: 100%          Assessment & Plan:   Wanda Jackson is a 35 y.o. female Head injury, acute, with loss of consciousness, 1 hour to 5 hours 59 minutes, initial encounter ([place]) - Plan: CT Head Wo Contrast  Wound, open, face, initial encounter  Other fatigue  Insomnia  Acute post-traumatic headache, not intractable  Closed head injury 2:30 AM. Loss of conscious for approximately one hour per her history, some increased headache as this proceeded, nonfocal neurologic exam however tender over right temple and increasing headache.   -CT head pending.   - Laceration on right face repaired as per procedure note.  -Tylenol, cool compresses only for now given head injury and risk of sedation.  -Wound care discussed, recheck 1 week, sooner if worse.   Oversedation during the day, insomnia at  night, may be due to higher dose of Zoloft.   -Recommended decreasing to 200 mg zoloft for now, coordinating this care with her primary care provider in [place].   -With concurrent head injury, recommend against Benadryl or other sedating medicines at this time. Advised against driving if sedated. Will discuss further at follow up in 1 week as not planning on return to [place] for about 2 weeks.    RTC/ER precautions   Meds ordered this encounter  Medications  . sertraline (ZOLOFT) 100 MG tablet    Sig: Take 300 mg by mouth daily.  . ondansetron (ZOFRAN) 8 MG tablet    Sig: Take 8 mg by mouth every 8 (eight) hours as needed for nausea or vomiting.   Patient Instructions   Please go to [facility] for your scheduled CT Scan Go to first floor Radiology and someone will get you from there Please stay there until you are given further instructions once the CT is complete    IF you received an x-ray today, you will receive an invoice from [facility]. Please contact [facility] at [phone] with questions or concerns regarding your invoice.   IF you received labwork today, you will  receive an invoice from [employer]. Please contact [facility] at [phone] with questions or concerns regarding your invoice.   Our billing staff will not be able to assist you with questions regarding bills from these companies.  You will be contacted with the lab results as soon as they are available. The fastest way to get your results is to activate your My Chart account. Instructions are located on the last page of this paperwork. If you have not heard from [place] regarding the results in 2 weeks, please contact this office.     We will schedule a CAT scan today to make sure there are no signs of bleeding, but this is less likely. For your sedation and trouble sleeping, I would try cutting back to 200 mg of Zoloft each day for now as this may be contributing. I would also avoid Benadryl or other sedating medications for the next 2-3 days with your recent head injury. Tylenol is okay to take for now if needed for headache, then in the next 2-3 days, if something stronger needed, we can look into options at that time.   WOUND CARE Please return in 7 days to have your stitches/staples removed or sooner if you have concerns. Marland Kitchen Keep area clean and dry for 24 hours. Do not remove bandage, if applied. . After 24 hours, remove bandage and wash wound gently with mild soap and warm water. Reapply a new bandage after cleaning wound, if directed. . Continue daily cleansing with soap and water until stitches/staples are removed. . Do not apply any ointments or creams to the wound while stitches/staples are in place, as this may cause delayed healing. . Notify the office if you experience any of the following signs of infection: Swelling, redness, pus drainage, streaking, fever >101.0 F . Notify the office if you experience excessive bleeding that does not stop after 15-20 minutes of constant, firm pressure.    Head Injury, Adult You have received a head injury. It  does not appear serious at this time. Headaches and vomiting are common following head injury. It should be easy to awaken from sleeping. Sometimes it is necessary for you to stay in the emergency department for a while for observation. Sometimes admission to the hospital may be needed.  After injuries such as yours, most problems occur within the first 24 hours, but side effects may occur up to 7-10 days after the injury. It is important for you to carefully monitor your condition and contact your health care provider or seek immediate medical care if there is a change in your condition. WHAT ARE THE TYPES OF HEAD INJURIES? Head injuries can be as minor as a bump. Some head injuries can be more severe. More severe head injuries include:  A jarring injury to the brain (concussion).  A bruise of the brain (contusion). This mean there is bleeding in the brain that can cause swelling.  A cracked skull (skull fracture).  Bleeding in the brain that collects, clots, and forms a bump (hematoma). WHAT CAUSES A HEAD INJURY? A serious head injury is most likely to happen to someone who is in a car wreck and is not wearing a seat belt. Other causes of major head injuries include bicycle or motorcycle accidents, sports injuries, and falls. HOW ARE HEAD INJURIES DIAGNOSED? A complete history of the event leading to the injury and your current symptoms will be helpful in diagnosing head injuries. Many times, pictures of the brain, such as CT or MRI are needed to see the extent of the injury. Often, an overnight hospital stay is necessary for observation.  WHEN SHOULD I SEEK IMMEDIATE MEDICAL CARE?  You should get help right away if:  You have confusion or drowsiness.  You feel sick to your stomach (nauseous) or have continued, forceful vomiting.  You have dizziness or unsteadiness that is getting worse.  You have severe, continued headaches not relieved by medicine. Only take over-the-counter or  prescription medicines for pain, fever, or discomfort as directed by your health care provider.  You do not have normal function of the arms or legs or are unable to walk.  You notice changes in the black spots in the center of the colored part of your eye (pupil).  You have a clear or bloody fluid coming from your nose or ears.  You have a loss of vision. During the next 24 hours after the injury, you must stay with someone who can watch you for the warning signs. This person should contact local emergency services (911 in the [place] if you have seizures, you become unconscious, or you are unable to wake up. HOW CAN I PREVENT A HEAD INJURY IN THE FUTURE? The most important factor for preventing major head injuries is avoiding motor vehicle accidents. To minimize the potential for damage to your head, it is crucial to wear seat belts while riding in motor vehicles. Wearing helmets while bike riding and playing collision sports (like football) is also helpful. Also, avoiding dangerous activities around the house will further help reduce your risk of head injury.  WHEN CAN I RETURN TO NORMAL ACTIVITIES AND ATHLETICS? You should be reevaluated by your health care provider before returning to these activities. If you have any of the following symptoms, you should not return to activities or contact sports until 1 week after the symptoms have stopped:  Persistent headache.  Dizziness or vertigo.  Poor attention and concentration.  Confusion.  Memory problems.  Nausea or vomiting.  Fatigue or tire easily.  Irritability.  Intolerant of bright lights or loud noises.  Anxiety or depression.  Disturbed sleep. MAKE SURE YOU:   Understand these instructions.  Will watch your condition.  Will get help right away if you are not doing well or get worse.  This information is not intended to replace advice given to you by your health care provider. Make sure you discuss any questions you  have with your health care provider.   Document Released: 11/09/2005 Document Revised: 11/30/2014 Document Reviewed: 07/17/2013 Elsevier Interactive Patient Education [employer].     I personally performed the services described in this documentation, which was scribed in my presence. The recorded information has been reviewed and considered, and addended by me as needed.   Signed,   Merri Ray, MD [facility].  04/21/2016 3:54 PM

## 2016-04-21 NOTE — Progress Notes (Signed)
Risk and benefits discussed and verbal consent obtained. Anesthetic allergies reviewed. Patient anesthetized using 1:1 mix of 2% lidocaine with epi and Marcaine. The wound was cleansed thoroughly with soap and water. Sterile prep and drape. Wound closed with 4 SI throws using 6-0 Ethilon suture material. Hemostasis achieved. Mupirocin applied to the wound and bandage placed. The patient tolerated well. Wound instructions were provided and the patient is to return in 6 days for suture removal.

## 2016-04-27 ENCOUNTER — Other Ambulatory Visit: Payer: Self-pay | Admitting: Family Medicine

## 2016-04-27 DIAGNOSIS — M791 Myalgia, unspecified site: Secondary | ICD-10-CM

## 2016-04-27 DIAGNOSIS — W19XXXA Unspecified fall, initial encounter: Secondary | ICD-10-CM

## 2016-04-27 DIAGNOSIS — S0093XA Contusion of unspecified part of head, initial encounter: Secondary | ICD-10-CM

## 2016-04-27 MED ORDER — HYDROCODONE-ACETAMINOPHEN 5-325 MG PO TABS: 1.0000 | ORAL_TABLET | Freq: Four times a day (QID) | ORAL | Status: AC | PRN

## 2016-04-28 ENCOUNTER — Encounter (HOSPITAL_COMMUNITY): Payer: Self-pay

## 2016-04-28 ENCOUNTER — Emergency Department (HOSPITAL_COMMUNITY)

## 2016-04-28 ENCOUNTER — Emergency Department (HOSPITAL_COMMUNITY)
Admission: EM | Admit: 2016-04-28 | Discharge: 2016-04-28 | Disposition: A | Discharge status: Home / Self Care | Attending: Emergency Medicine | Admitting: Emergency Medicine

## 2016-04-28 DIAGNOSIS — S70911A Unspecified superficial injury of right hip, initial encounter: Secondary | ICD-10-CM | POA: Diagnosis present

## 2016-04-28 DIAGNOSIS — Y999 Unspecified external cause status: Secondary | ICD-10-CM | POA: Insufficient documentation

## 2016-04-28 DIAGNOSIS — Y9289 Other specified places as the place of occurrence of the external cause: Secondary | ICD-10-CM | POA: Diagnosis not present

## 2016-04-28 DIAGNOSIS — Z4802 Encounter for removal of sutures: Secondary | ICD-10-CM | POA: Insufficient documentation

## 2016-04-28 DIAGNOSIS — W1812XA Fall from or off toilet with subsequent striking against object, initial encounter: Secondary | ICD-10-CM | POA: Diagnosis not present

## 2016-04-28 DIAGNOSIS — Y939 Activity, unspecified: Secondary | ICD-10-CM | POA: Diagnosis not present

## 2016-04-28 DIAGNOSIS — F329 Major depressive disorder, single episode, unspecified: Secondary | ICD-10-CM | POA: Insufficient documentation

## 2016-04-28 DIAGNOSIS — Z91040 Latex allergy status: Secondary | ICD-10-CM | POA: Diagnosis not present

## 2016-04-28 DIAGNOSIS — T148XXA Other injury of unspecified body region, initial encounter: Secondary | ICD-10-CM

## 2016-04-28 DIAGNOSIS — S7001XA Contusion of right hip, initial encounter: Secondary | ICD-10-CM | POA: Insufficient documentation

## 2016-04-28 DIAGNOSIS — Z859 Personal history of malignant neoplasm, unspecified: Secondary | ICD-10-CM | POA: Insufficient documentation

## 2016-04-28 MED ORDER — OXYCODONE HCL 5 MG PO TABS: 5.0000 mg | ORAL_TABLET | Freq: Four times a day (QID) | ORAL | Status: AC | PRN

## 2016-04-28 NOTE — Discharge Instructions (Signed)
Please read and follow all provided instructions.  Your diagnoses today include:  1. Visit for suture removal   2. Hematoma    Tests performed today include:  Vital signs. See below for your results today.   Medications prescribed:   Take as prescribed   Home care instructions:  Follow any educational materials contained in this packet. Use Heat compresses over the area  Follow-up instructions: Please follow-up with your primary care provider for further evaluation of symptoms and treatment   Return instructions:   Please return to the Emergency Department if you do not get better, if you get worse, or new symptoms OR  - Fever (temperature greater than 101.20F)  - Bleeding that does not stop with holding pressure to the area    -Severe pain (please note that you may be more sore the day after your accident)  - Chest Pain  - Difficulty breathing  - Severe nausea or vomiting  - Inability to tolerate food and liquids  - Passing out  - Skin becoming red around your wounds  - Change in mental status (confusion or lethargy)  - New numbness or weakness     Please return if you have any other emergent concerns.  Additional Information:  Your vital signs today were: BP 133/77 mmHg   Pulse 57   Temp(Src) 98.7 F (37.1 C) (Oral)   Resp 18   Ht 5\' 8"  (1.727 m)   Wt 63.05 kg   BMI 21.14 kg/m2   SpO2 100%   LMP 02/01/2016 If your blood pressure (BP) was elevated above 135/85 this visit, please have this repeated by your doctor within one month. ---------------

## 2016-04-28 NOTE — ED Notes (Signed)
Pt fell last week and was seen at [facility] and received stitches to her eye, as the week went on she noticed her right hip becoming more sore and she has a knot on the side of it, she says that she's having a hard time bending and walking and it's really sore

## 2016-04-28 NOTE — ED Provider Notes (Signed)
CSN: [id number]     Arrival date & time 04/28/16  1807 History  By signing my name below, I, [facility], attest that this documentation has been prepared under the direction and in the presence of Shary Decamp, PA-C. Electronically Signed: Virgel Bouquet, ED Scribe. 04/28/2016. 8:52 PM.   Chief Complaint  Patient presents with  . Fall    The history is provided by the patient. No language interpreter was used.   HPI Comments: Wanda Jackson is a 35 y.o. female with an hx of clotting disorder who presents to the Emergency Department complaining of constant, gradually worsening, mild right hip pain onset 5 days ago after a fall that occurred 1 week ago. Pt states that she fell while standing in the kitchen where she struck the side of her head and lost consciousness, was evaluated at [facility]gent [facility], and discharged with symptomatic treatment. She notes that 2 days after the fall she noticed that her hip began to cause her pain. She reports associated painful ambulation and a gradually increasing in swelling lump on her posterior right hip. She has been able to ambulate without difficulty despite pain. Pain is worse with movement and ambulation. She has taken liquid Tylenol without relief. Per pt, she has an hx of gastric bypass and a clotting disorder and noted that she is concerned about a possible hematoma in her hip. She reports that she is from [place] and has scheduled a follow-up appointment for next week when she returns home. Denies hx of DVTs. Denies any other symptom currently.  Past Medical History  Diagnosis Date  . Anxiety   . Cancer ([place])   . Clotting disorder ([place])   . Depression   . Heart murmur    Past Surgical History  Procedure Laterality Date  . Cosmetic surgery    . Hernia repair    . Small intestine surgery    . Joint replacement    . Gastric bypass     Family History  Problem Relation Age of Onset  . Heart disease Mother   . Mental illness Mother   .  Hyperlipidemia Paternal Grandmother    Social History  Substance Use Topics  . Smoking status: Never Smoker   . Smokeless tobacco: None  . Alcohol Use: 0.0 oz/week    0 Standard drinks or equivalent per week   OB History    No data available     Review of Systems  Musculoskeletal: Positive for arthralgias (right hip).  Skin: Negative for color change.   Allergies  Bee venom and Latex  Home Medications   Prior to Admission medications   Medication Sig Start Date End Date Taking? Authorizing Provider  HYDROcodone-acetaminophen (NORCO/VICODIN) 5-325 MG tablet Take 1 tablet by mouth every 6 (six) hours as needed for moderate pain. 04/27/16   Wendie Agreste, MD  ondansetron (ZOFRAN) 8 MG tablet Take 8 mg by mouth every 8 (eight) hours as needed for nausea or vomiting.    Historical Provider, MD  sertraline (ZOLOFT) 100 MG tablet Take 300 mg by mouth daily.    Historical Provider, MD   BP 133/77 mmHg  Pulse 57  Temp(Src) 98.7 F (37.1 C) (Oral)  Resp 18  Ht 5\' 8"  (1.727 m)  Wt 139 lb (63.05 kg)  BMI 21.14 kg/m2  SpO2 100%  LMP 02/01/2016   Physical Exam  Constitutional: She is oriented to person, place, and time. She appears well-developed and well-nourished. No distress.  HENT:  Head: Normocephalic and atraumatic.  1 cm laceration that is sutured noted on right eyebrow. Wound well healed   Eyes: Conjunctivae are normal.  Neck: Normal range of motion.  Cardiovascular: Normal rate.   Pulmonary/Chest: Effort normal. No respiratory distress.  Musculoskeletal: Normal range of motion.  FROM on right hip. Nodule noted over posterior right hip. No superficial hematoma visualized pr surrounding erythema noted. Neurovascularly intact.  Neurological: She is alert and oriented to person, place, and time.  Skin: Skin is warm and dry.  Psychiatric: She has a normal mood and affect. Her behavior is normal.  Nursing note and vitals reviewed.  ED Course  Procedures   DIAGNOSTIC  STUDIES: Oxygen Saturation is 100% on RA, normal by my interpretation.    COORDINATION OF CARE: 8:38 PM Advised pt to apply warm compresses to right hip. Will perform suture removal. Discussed treatment plan with pt at bedside and pt agreed to plan.  8:57 PM Returned to perform suture removal.  SUTURE REMOVAL Performed by: Shary Decamp, PA-C Authorized by: Lacretia Leigh, MD Consent: Verbal consent obtained. Consent given by: patient Required items: required blood products, implants, devices, and special equipment available  Time out: Immediately prior to procedure a "time out" was called to verify the correct patient, procedure, equipment, support staff and site/side marked as required. Location: right eye brow Wound Appearance: Clean. Well healed Sutures Removed: 4 Patient tolerance: Patient tolerated the procedure well with no immediate complications.   MDM  I have reviewed the relevant previous healthcare records. I obtained HPI from historian.  ED Course:  Assessment: Pt is a 34yF with hx Factor V [place] who presents with hematoma to right hip. On exam, pt in NAD. Nontoxic/nonseptic appearing. VSS. Afebrile. Lungs CTA. Heart RRR. Abdomen nontender soft. Nodule palpated on posterior right hip. Full ROM without difficulty. Able to ambulate. No erythema or ecchymosis noted. Also removed 4 sutures from previous UC visit. Wound healed well. No signs of infection Plan is to [place] and follow up to PCp. At time of discharge, Patient is in no acute distress. Vital Signs are stable. Patient is able to ambulate. Patient able to tolerate PO.   Disposition/Plan:  DC Home Additional Verbal discharge instructions given and discussed with patient.  Pt Instructed to f/u with PCP in the next week for evaluation and treatment of symptoms. Return precautions given Pt acknowledges and agrees with plan  Supervising Physician Lacretia Leigh, MD  Final diagnoses:  Visit for suture removal   Hematoma   I personally performed the services described in this documentation, which was scribed in my presence. The recorded information has been reviewed and is accurate.    Shary Decamp, PA-C 04/28/16 2123  Lacretia Leigh, MD 04/29/16 2019

## 2017-08-26 IMAGING — CT CT HEAD W/O CM
3 of 4 series · 17 of 47 positions shown, 20 images · non-contrast
Comparison: None.

CLINICAL DATA: Fall.  Head injury

EXAM:
CT HEAD WITHOUT CONTRAST
TECHNIQUE: Contiguous axial images were obtained from the base of the skull
through the vertex without intravenous contrast.

[Series 4: headseq 2.4 h60s · axial · 0.43mm/px · z∈[-118,+7]mm · 11 of 60 slices shown, 14 images]
[im 4/60  brain]
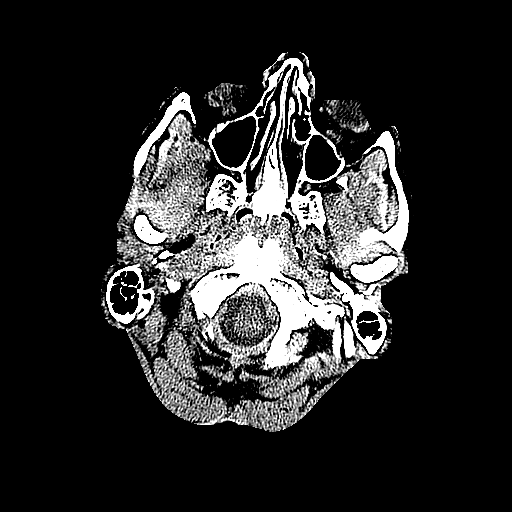
[im 4/60  bone]
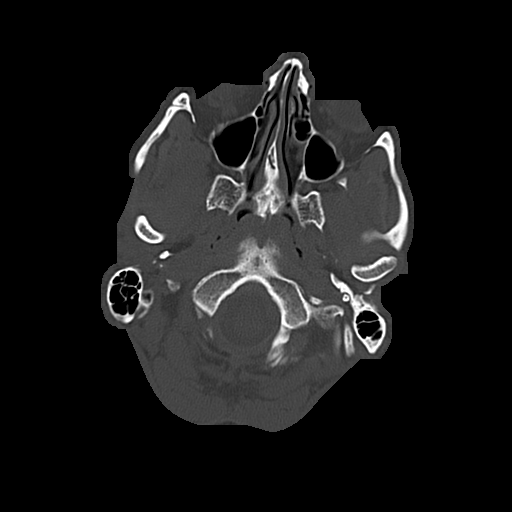
[im 10/60  brain]
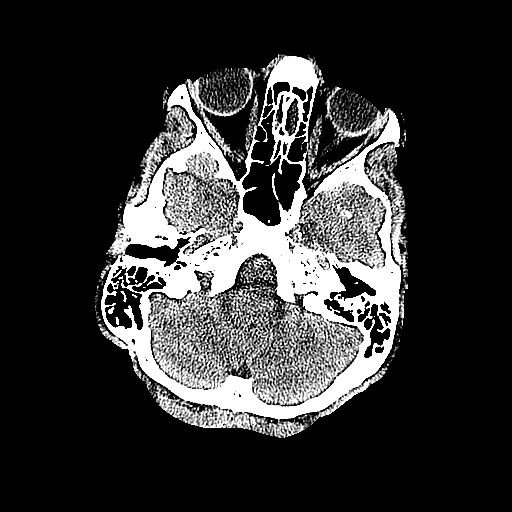
[im 16/60  brain]
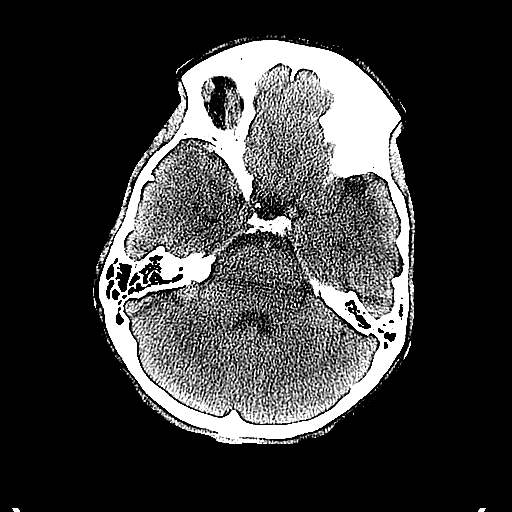
[im 19/60  brain]
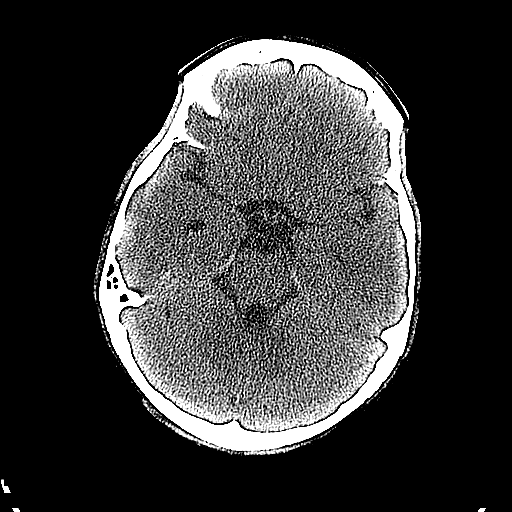
[im 25/60  brain]
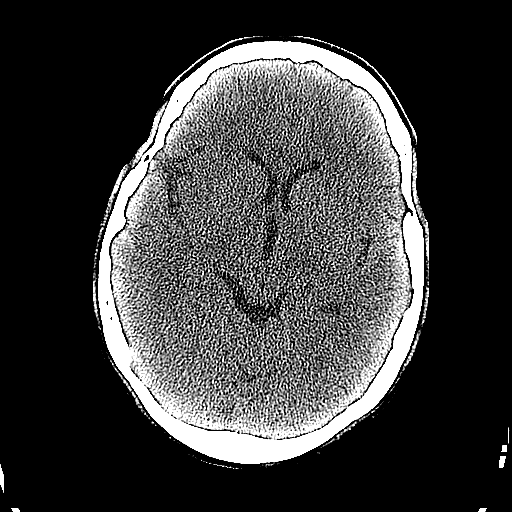
[im 25/60  bone]
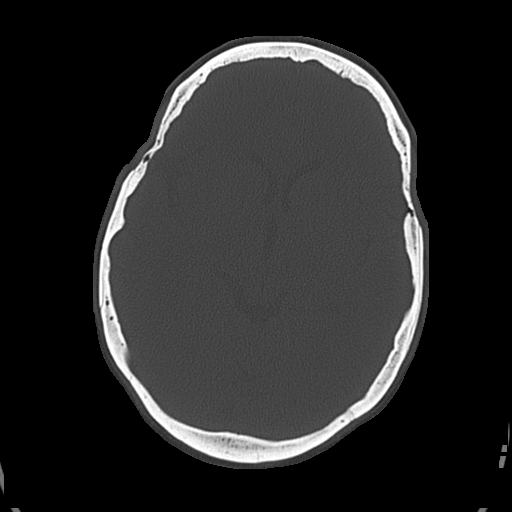
[im 32/60  brain]
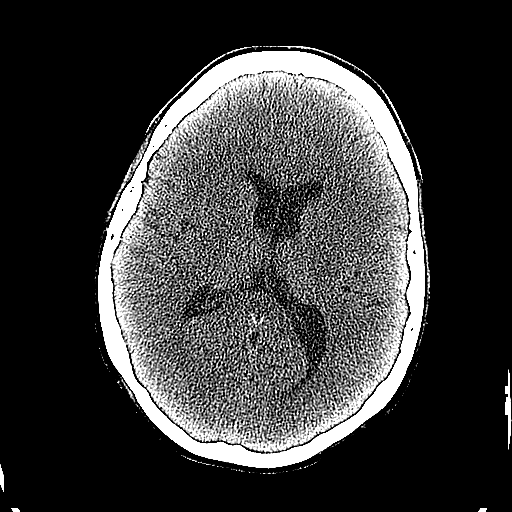
[im 35/60  brain]
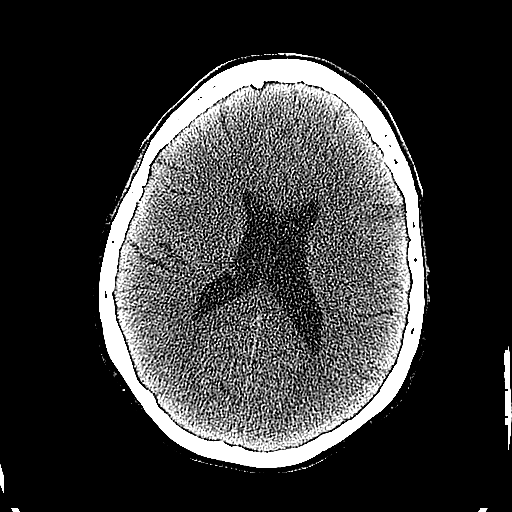
[im 41/60  brain]
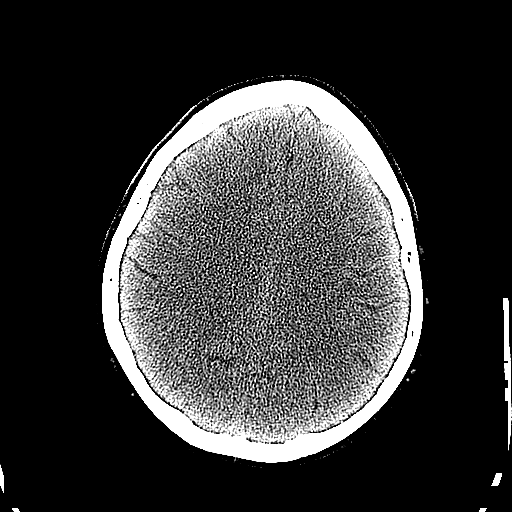
[im 44/60  brain]
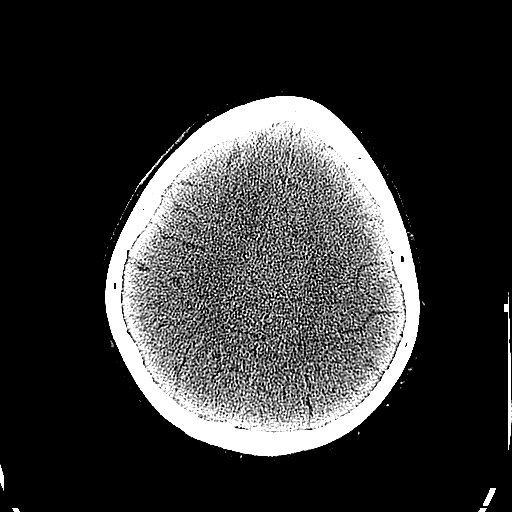
[im 44/60  bone]
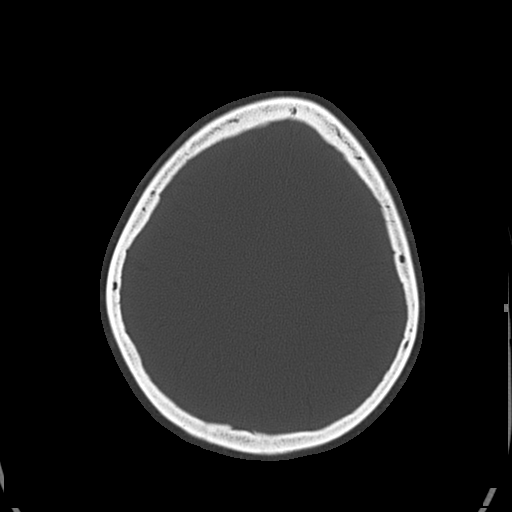
[im 50/60  brain]
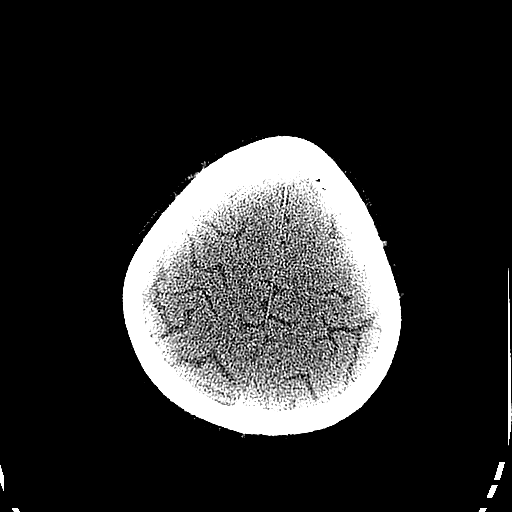
[im 56/60  brain]
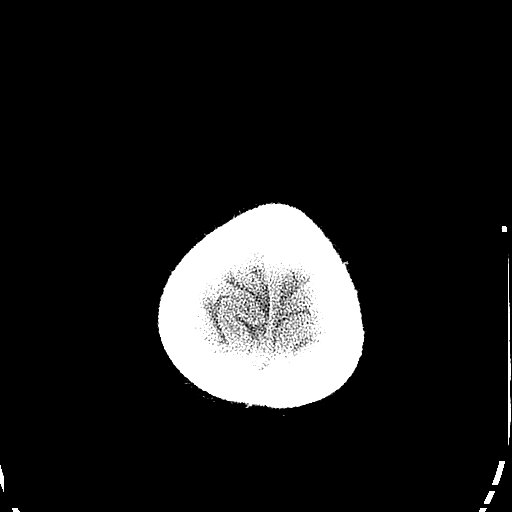

[Series 602: <mpr thick range> · coronal · 0.48mm/px · 3 of 62 slices shown]
[im 21/62  brain]
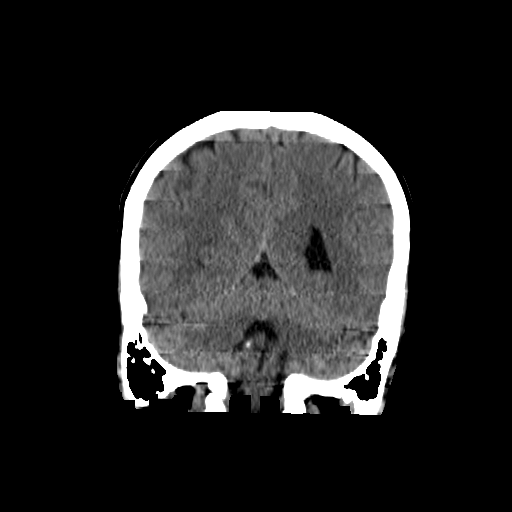
[im 28/62  brain]
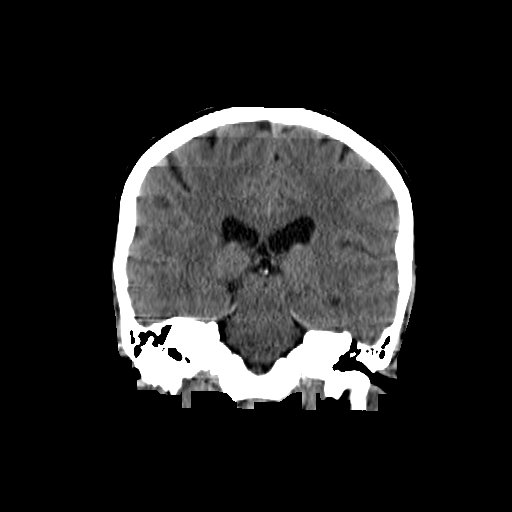
[im 34/62  brain]
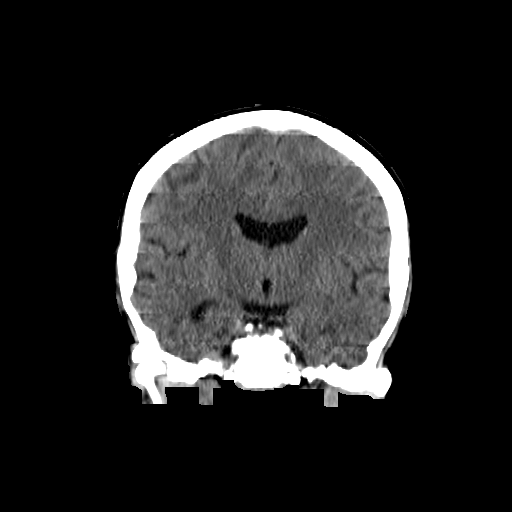

[Series 603: <mpr thick range(1)> · sagittal · 0.48mm/px · 3 of 54 slices shown]
[im 18/54  brain]
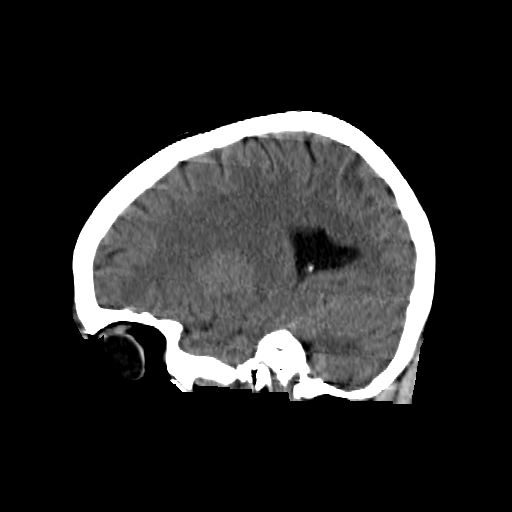
[im 27/54  brain]
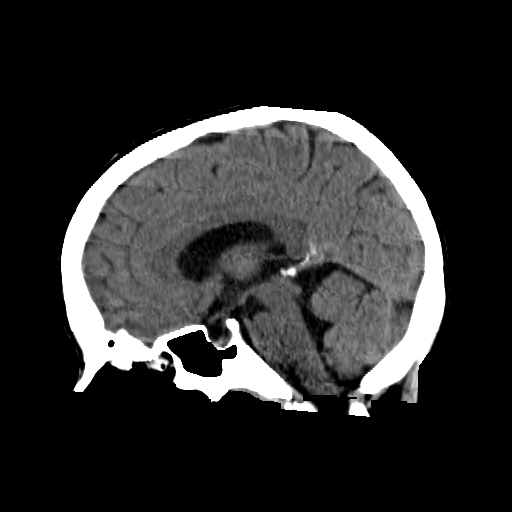
[im 36/54  brain]
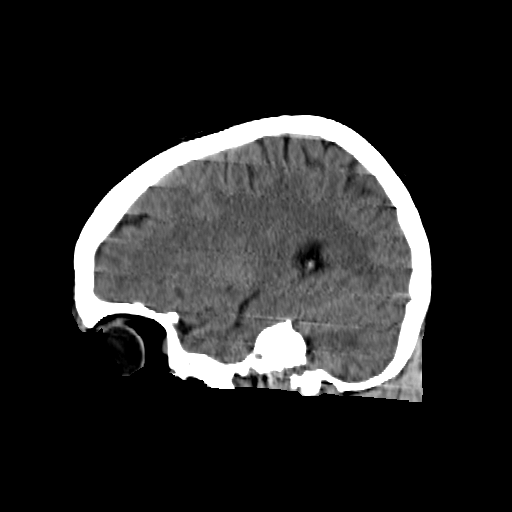

[17 of 47 positions shown; findings below may reference images not displayed]

FINDINGS: Ventricle size is normal. Negative for acute or chronic infarction.
Negative for hemorrhage or fluid collection. Negative for mass or
edema. No shift of the midline structures.

Calvarium is intact.

Small gas bubble lateral to the right orbit and soft tissues likely
due to laceration.
IMPRESSION: Normal CT of the brain

Laceration lateral to the right orbit.
